# Patient Record
Sex: Male | Born: 1990 | Race: Black or African American | Hispanic: No | Marital: Single | Smoking: Former smoker
Health system: Southern US, Community
[De-identification: ages and names within clinical notes are randomized; demographics above are authoritative.]

## PROBLEM LIST (undated history)

## (undated) DIAGNOSIS — F419 Anxiety disorder, unspecified: Secondary | ICD-10-CM

## (undated) DIAGNOSIS — R0602 Shortness of breath: Secondary | ICD-10-CM

## (undated) DIAGNOSIS — R002 Palpitations: Secondary | ICD-10-CM

## (undated) DIAGNOSIS — I429 Cardiomyopathy, unspecified: Secondary | ICD-10-CM

## (undated) DIAGNOSIS — R0789 Other chest pain: Secondary | ICD-10-CM

## (undated) HISTORY — DX: Anxiety disorder, unspecified: F41.9

## (undated) HISTORY — DX: Palpitations: R00.2

## (undated) HISTORY — DX: Other chest pain: R07.89

## (undated) HISTORY — DX: Cardiomyopathy, unspecified: I42.9

## (undated) HISTORY — DX: Shortness of breath: R06.02

---
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
## 2011-01-21 IMAGING — CR DG ELBOW 2V*L*
2 series · 2 of 2 positions shown · non-contrast
Comparison: Left elbow radiographs performed earlier today at [DATE]
p.m.

CLINICAL DATA: Status post reduction of elbow dislocation.

LEFT ELBOW - 2 VIEW

[view not recorded (1 of 2)]
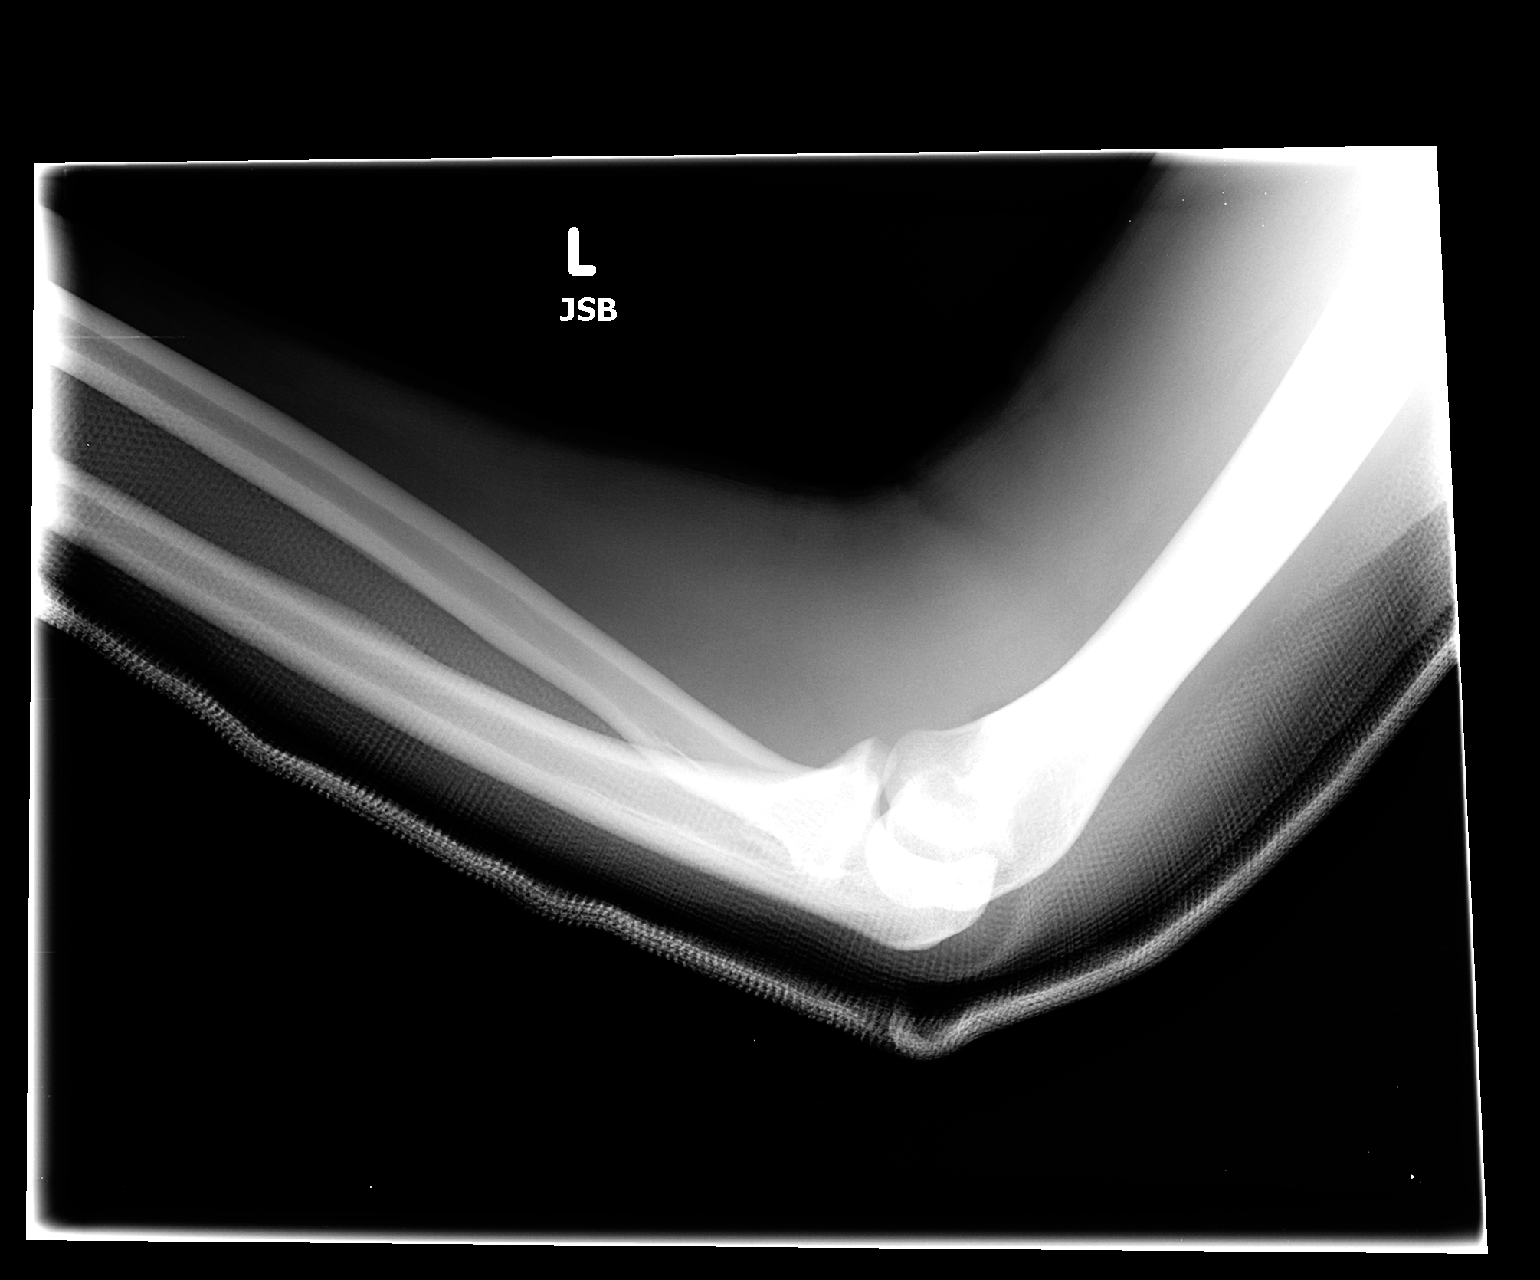

[view not recorded (2 of 2)]
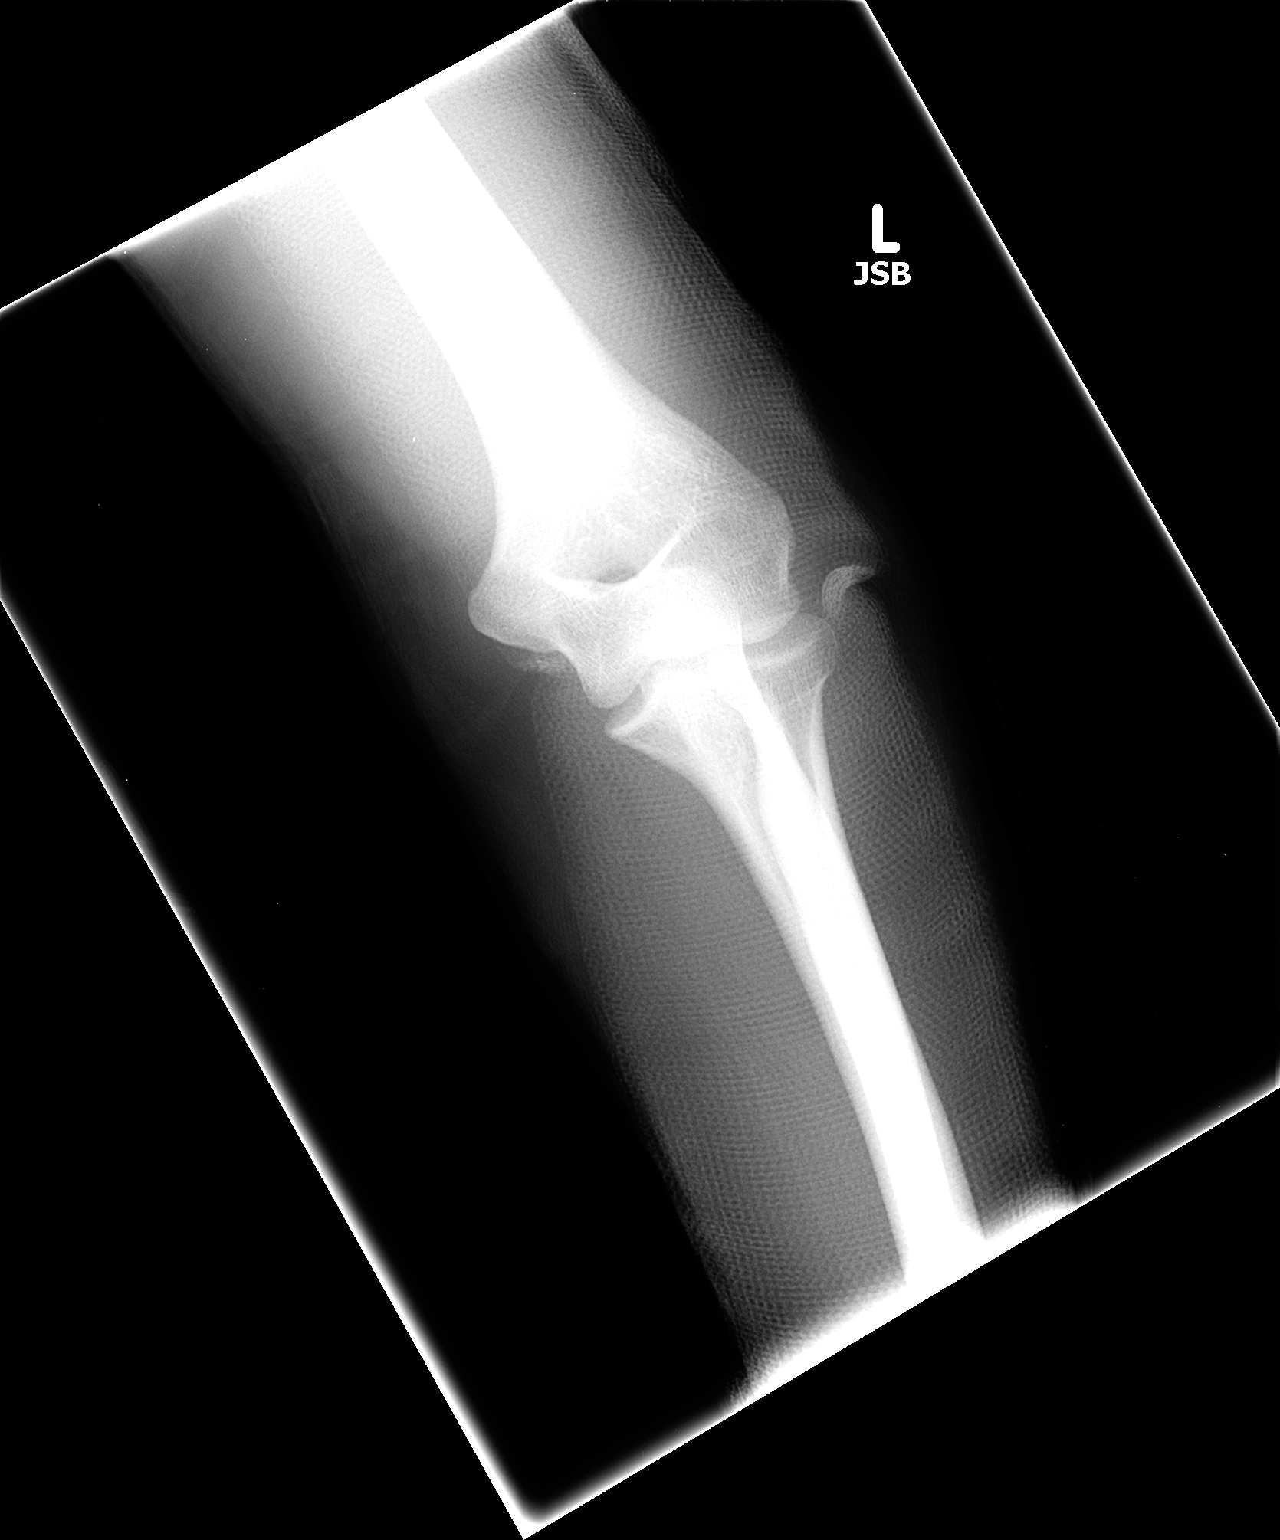

[2 of 2 positions shown; findings below may reference images not displayed]

FINDINGS: There has been interval reduction of the previously noted
elbow dislocation.  There is no evidence of associated fracture.  A
small associated joint effusion is likely present, although
difficult to fully characterize.  No additional soft tissue
abnormalities are appreciated, although evaluation of the soft
tissues is mildly suboptimal due to the overlying splint.
IMPRESSION: Interval reduction of elbow dislocation; no definite associated
fractures seen.  Likely associated small joint effusion.

## 2011-01-21 IMAGING — CR DG ELBOW 2V*L*
2 series · 2 of 2 positions shown · non-contrast
Comparison: None available.

CLINICAL DATA: Injury, pain.

LEFT ELBOW - 2 VIEW

[AP]
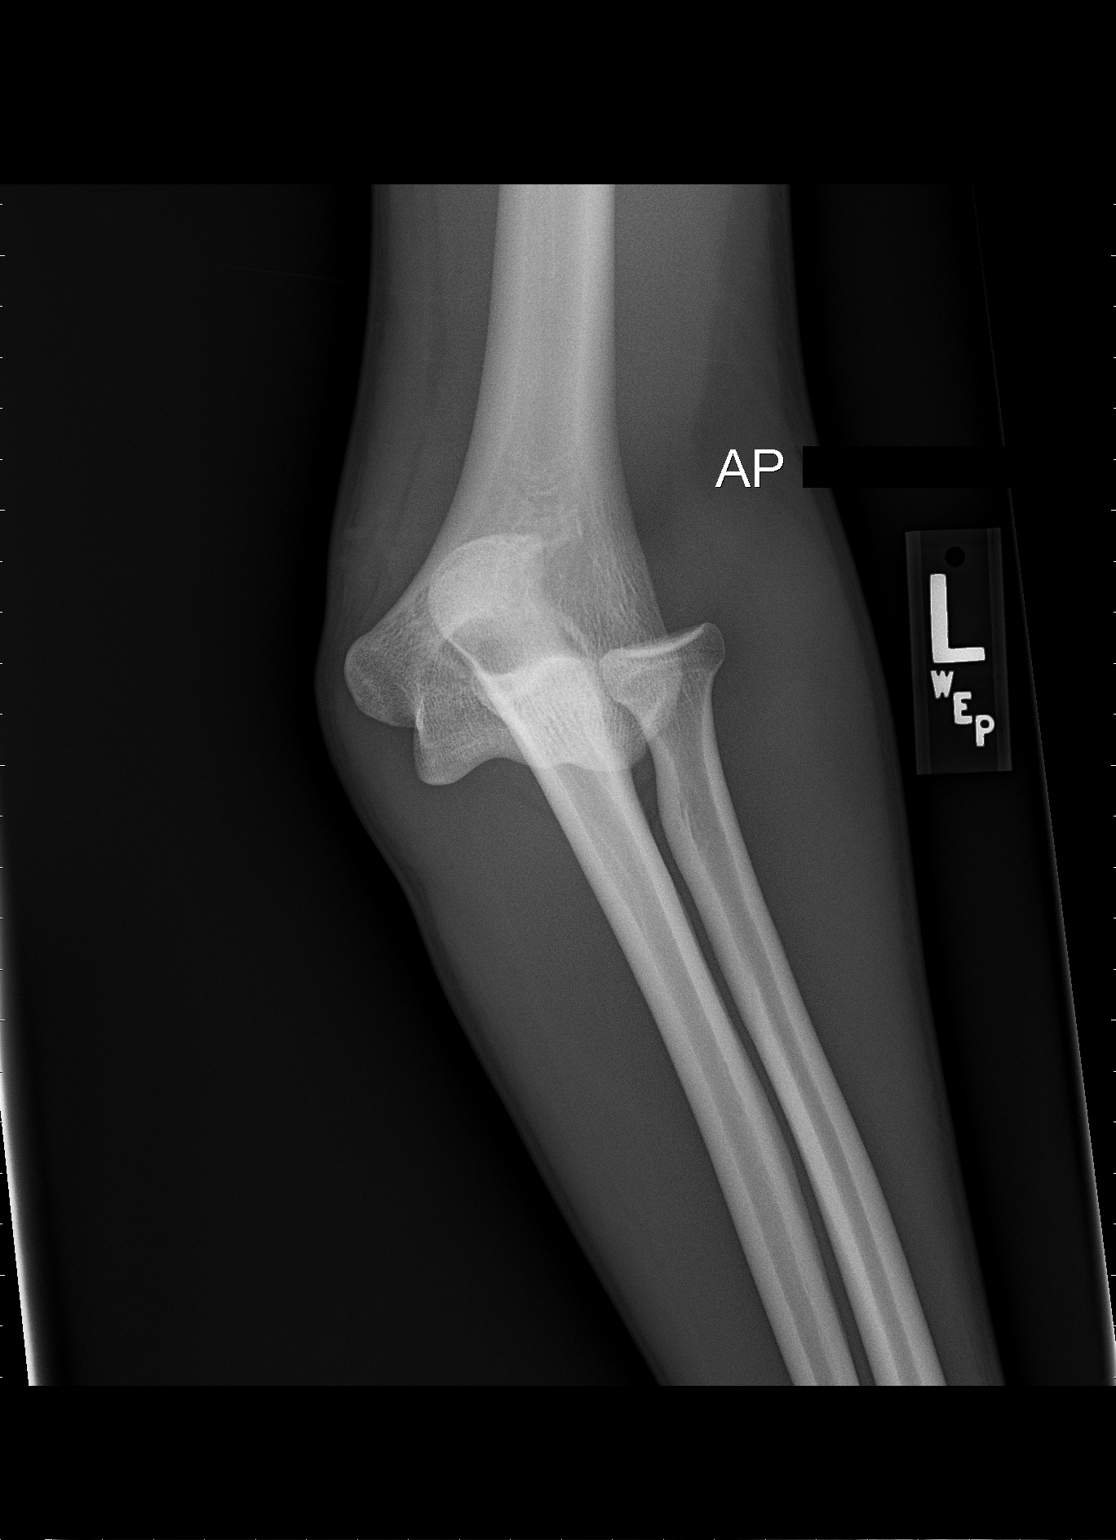

[lat elbow]
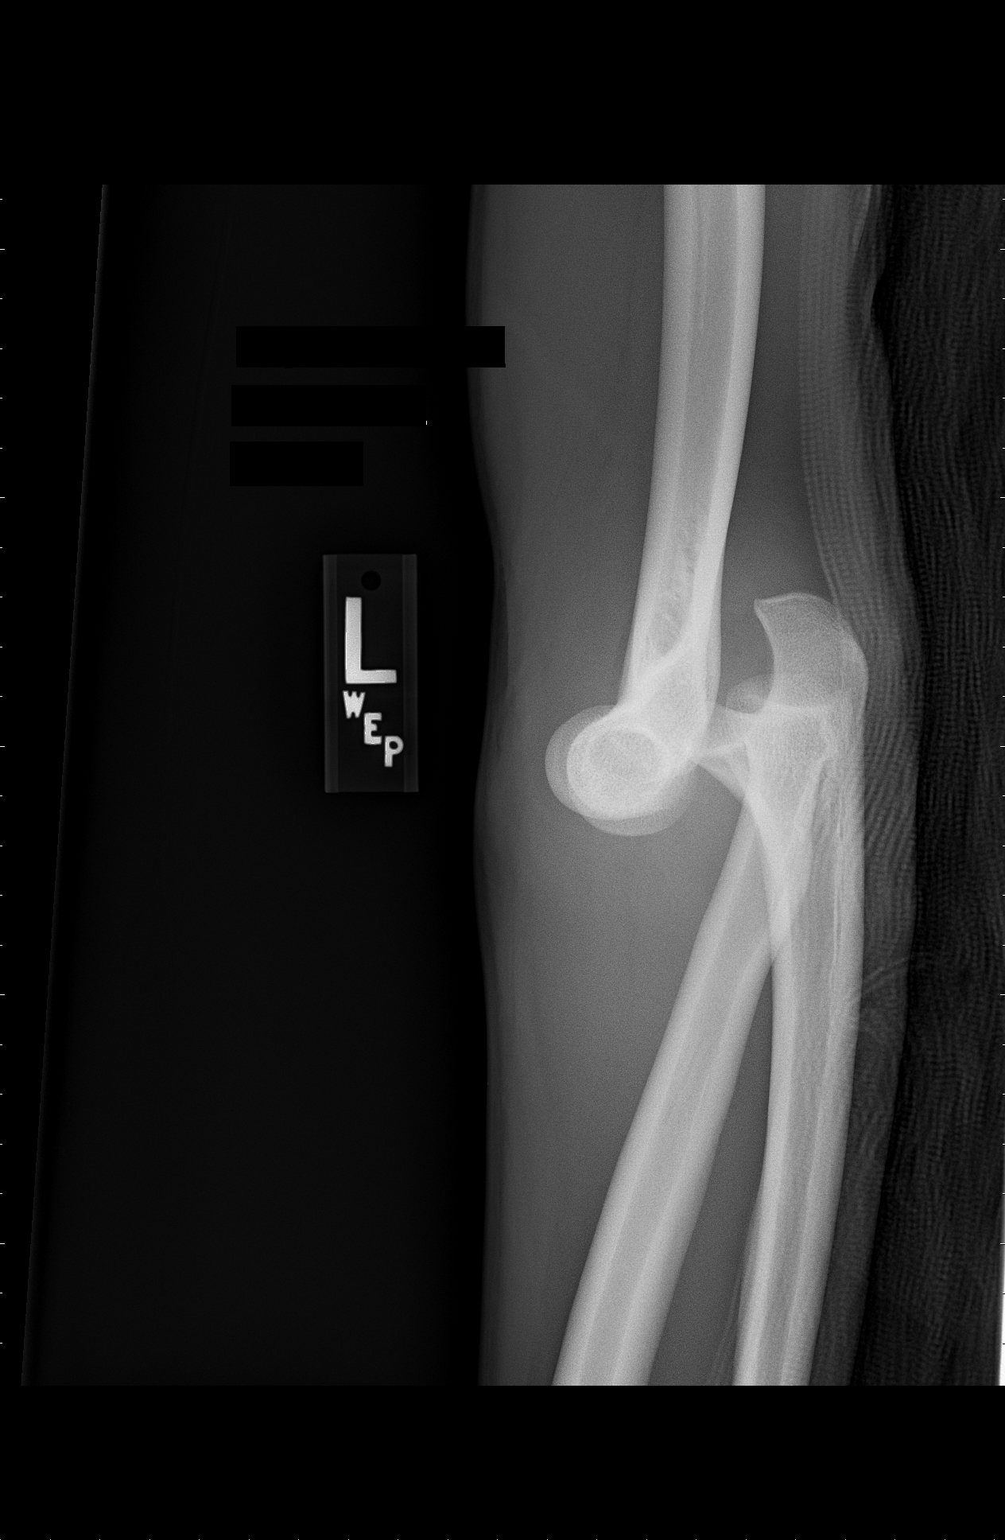

[2 of 2 positions shown; findings below may reference images not displayed]

FINDINGS: There is complete posterior dislocation of the left
elbow.  No fracture is identified.
IMPRESSION: Posterior dislocation left elbow.

## 2013-01-03 ENCOUNTER — Telehealth: Payer: Self-pay

## 2013-01-03 NOTE — Telephone Encounter (Signed)
Phone number not valid so abstracted all I could from hospital encounter 09/2010

## 2013-01-10 ENCOUNTER — Encounter: Payer: Self-pay | Admitting: Internal Medicine

## 2013-01-10 ENCOUNTER — Encounter (INDEPENDENT_AMBULATORY_CARE_PROVIDER_SITE_OTHER): Payer: Self-pay

## 2013-01-10 ENCOUNTER — Encounter: Payer: Self-pay | Admitting: *Deleted

## 2013-01-10 ENCOUNTER — Ambulatory Visit (INDEPENDENT_AMBULATORY_CARE_PROVIDER_SITE_OTHER): Payer: BC Managed Care – PPO | Admitting: Internal Medicine

## 2013-01-10 ENCOUNTER — Other Ambulatory Visit: Payer: Self-pay | Admitting: *Deleted

## 2013-01-10 VITALS — BP 125/75 | HR 61 | Ht 70.0 in | Wt 149.8 lb

## 2013-01-10 DIAGNOSIS — Z139 Encounter for screening, unspecified: Secondary | ICD-10-CM

## 2013-01-10 NOTE — Patient Instructions (Signed)
Your physician recommends that you schedule a follow-up appointment in: as needed  

## 2013-01-10 NOTE — Telephone Encounter (Signed)
error 

## 2013-01-10 NOTE — Progress Notes (Addendum)
HPI Patient is a 23 yo who presents for clearance to enter [facility] [employer] [facility] [employer] He has no known history of cardiac problems  IN 2012 [place] in [place] was seen in ER for palpitaitons.  Episode lasted about 7 min  No dizziness  Snet out of ER  Holter monitor was done that showed no arrhythmia.  Echo was done that was normal The patient is acitve  Plays a lot of basketball   Denies SOB  No CP  No palpitations  No dizziness No Known Allergies  No current outpatient prescriptions on file.   No current facility-administered medications for this visit.    No past medical history on file.  No past surgical history on file.  No family history on file.  History   Social History  . Marital Status: Single    Spouse Name: N/A    Number of Children: N/A  . Years of Education: N/A   Occupational History  . Not on file.   Social History Main Topics  . Smoking status: Former Games developermoker  . Smokeless tobacco: Not on file  . Alcohol Use: Not on file  . Drug Use: Yes    Special: Marijuana  . Sexual Activity: Not on file   Other Topics Concern  . Not on file   Social History Narrative  . No narrative on file    Review of Systems:  All systems reviewed.  They are negative to the above problem except as previously stated.  Vital Signs: BP 125/75  Pulse 61  Ht 5\' 10"  (1.778 m)  Wt 149 lb 12.8 oz (67.949 kg)  BMI 21.49 kg/m2  Physical Exam Patient is in NAD HEENT:  Normocephalic, atraumatic. EOMI, PERRLA.  Neck: JVP is normal.  No bruits.  Lungs: clear to auscultation. No rales no wheezes.  Heart: Regular rate and rhythm. Normal S1, S2. No S3.   No significant murmurs. PMI not displaced.  Abdomen:  Supple, nontender. Normal bowel sounds. No masses. No hepatomegaly.  Extremities:   Good distal pulses throughout. No lower extremity edema.  Musculoskeletal :moving all extremities.  Neuro:   alert and oriented x3.  CN II-XII grossly intact.  EKG  SR  61 bpm  RAD Assessment and  Plan:   Patient is a 23 yo who presents for cardiac eval / clearance before entering [employer]  He has no cardiac history  Clinically he is doing well  Exam is normal  EKG is WNL Overall I think he is OK to enter [facility] [employer] with no restrictions
